# Patient Record
Sex: Male | Born: 1990 | Race: White | Hispanic: No | Marital: Married | Smoking: Never smoker
Health system: Southern US, Community
[De-identification: ages and names within clinical notes are randomized; demographics above are authoritative.]

---

## 2012-05-28 ENCOUNTER — Encounter (HOSPITAL_COMMUNITY): Payer: Self-pay | Admitting: *Deleted

## 2012-05-28 ENCOUNTER — Emergency Department (HOSPITAL_COMMUNITY): Payer: 59

## 2012-05-28 ENCOUNTER — Emergency Department (HOSPITAL_COMMUNITY)
Admission: EM | Admit: 2012-05-28 | Discharge: 2012-05-29 | Disposition: A | Discharge status: Home / Self Care | Payer: 59 | Attending: Emergency Medicine | Admitting: Emergency Medicine

## 2012-05-28 DIAGNOSIS — S8263XA Displaced fracture of lateral malleolus of unspecified fibula, initial encounter for closed fracture: Secondary | ICD-10-CM | POA: Insufficient documentation

## 2012-05-28 DIAGNOSIS — X58XXXA Exposure to other specified factors, initial encounter: Secondary | ICD-10-CM | POA: Insufficient documentation

## 2012-05-28 MED ORDER — ETOMIDATE 2 MG/ML IV SOLN
10.0000 mg | Freq: Once | INTRAVENOUS | Status: DC
  Filled 2012-05-28: qty 10

## 2012-05-28 MED ORDER — OXYCODONE-ACETAMINOPHEN 5-325 MG PO TABS
1.0000 | ORAL_TABLET | Freq: Once | ORAL | Status: AC
  Administered 2012-05-29: 1 via ORAL
  Filled 2012-05-28: qty 1

## 2012-05-28 MED ORDER — ONDANSETRON HCL 4 MG/2ML IJ SOLN
INTRAMUSCULAR | Status: AC
  Filled 2012-05-28: qty 2

## 2012-05-28 MED ORDER — OXYCODONE-ACETAMINOPHEN 5-325 MG PO TABS: 1.0000 | ORAL_TABLET | Freq: Four times a day (QID) | ORAL | Status: AC | PRN

## 2012-05-28 MED ORDER — ETOMIDATE 2 MG/ML IV SOLN
INTRAVENOUS | Status: DC | PRN
  Administered 2012-05-28: 10 mg via INTRAVENOUS

## 2012-05-28 MED ORDER — HYDROMORPHONE HCL PF 1 MG/ML IJ SOLN
1.0000 mg | Freq: Once | INTRAMUSCULAR | Status: AC
  Administered 2012-05-28: 1 mg via INTRAVENOUS
  Filled 2012-05-28 (×2): qty 1

## 2012-05-28 MED ORDER — HYDROMORPHONE HCL PF 1 MG/ML IJ SOLN
1.0000 mg | Freq: Once | INTRAMUSCULAR | Status: AC
  Administered 2012-05-28: 1 mg via INTRAVENOUS
  Filled 2012-05-28: qty 1

## 2012-05-28 MED ORDER — HYDROMORPHONE HCL PF 1 MG/ML IJ SOLN
INTRAMUSCULAR | Status: DC | PRN
  Administered 2012-05-28: 1 mg

## 2012-05-28 MED ORDER — HYDROMORPHONE HCL PF 1 MG/ML IJ SOLN
1.0000 mg | Freq: Once | INTRAMUSCULAR | Status: DC
  Filled 2012-05-28: qty 1

## 2012-05-28 MED ORDER — ONDANSETRON HCL 4 MG/2ML IJ SOLN
4.0000 mg | Freq: Once | INTRAMUSCULAR | Status: AC
  Administered 2012-05-28: 4 mg via INTRAVENOUS

## 2012-05-28 NOTE — ED Notes (Signed)
Conscious sedation being performed presently

## 2012-05-28 NOTE — ED Notes (Signed)
10/10  O2 sat decreased to 91 %,  Due to medication,  Pt placed on oxygen at [facility]  Increased to 100 %  ,  All vital signs stable presently

## 2012-05-28 NOTE — ED Provider Notes (Addendum)
History     CSN: [id number]  Arrival date & time 05/28/12  1956   First MD Initiated Contact with Patient 05/28/12 2046      Chief Complaint  Patient presents with  . Ankle Injury    (Consider location/radiation/quality/duration/timing/severity/associated sxs/prior treatment) HPI Comments: Playing rugby felt pop in ankle while turning   The history is provided by the patient.    History reviewed. No pertinent past medical history.  History reviewed. No pertinent past surgical history.  History reviewed. No pertinent family history.  History  Substance Use Topics  . Smoking status: Never Smoker   . Smokeless tobacco: Not on file  . Alcohol Use: No      Review of Systems  Constitutional: Negative for fever and chills.  Musculoskeletal: Positive for joint swelling and gait problem.  Skin: Negative for wound.  Neurological: Negative for dizziness, weakness and numbness.    Allergies  Review of patient's allergies indicates no known allergies.  Home Medications   Current Outpatient Rx  Name Route Sig Dispense Refill  . OXYCODONE-ACETAMINOPHEN 5-325 MG PO TABS Oral Take 1-2 tablets by mouth every 6 (six) hours as needed for pain. 20 tablet 0    BP 157/94  Pulse 86  Temp 98.5 F (36.9 C) (Oral)  Resp 21  SpO2 100%  Physical Exam  Constitutional: He appears well-developed.  HENT:  Head: Normocephalic.  Eyes: Pupils are equal, round, and reactive to light.  Neck: Normal range of motion.  Cardiovascular: Normal rate.   Pulmonary/Chest: Effort normal.  Musculoskeletal: Normal range of motion. He exhibits tenderness. He exhibits no edema.  Neurological: He is alert.  Skin: Skin is warm.    ED Course  Procedures (including critical care time)  Labs Reviewed - No data to display Dg Ankle Complete Left  05/28/2012  *RADIOLOGY REPORT*  Clinical Data: Right the injury.  Left ankle pain and swelling.  LEFT ANKLE COMPLETE - 3+ VIEW  Comparison: None.   Findings: Oblique fracture lateral malleolus noted.  Distal tibia is anteriorly dislocated with respect to the talus.  Indistinct calcifications posterior to the distal tibia are from uncertain donor site but possibly the medial malleolus or posterior malleolus.  IMPRESSION:  1.  Fracture dislocation of the ankle, with anterior dislocation of the tibia with respect to the talus, oblique lateral malleolar fracture, and indistinct bony fragments posterior to the distal tibial rim possibly representing avulsion from the medial malleolus.   Original Report Authenticated By: Dellia Cloud, M.D.    Dg Ankle Left Port  05/28/2012  *RADIOLOGY REPORT*  Clinical Data: Status post fracture reduction.  PORTABLE LEFT ANKLE - 2 VIEW  Comparison: Plain films 05/28/2012 2042 hours.  Findings: The patient is in a plaster splint.  The tibiotalar joint has been reduced.  Distal fibular fracture again seen.  No new abnormality.  IMPRESSION: Successful reduction of the tibiotalar joint.  No new abnormality.   Original Report Authenticated By: Bernadene Bell. D'ALESSIO, M.D.      1. Ankle fracture, lateral malleolus, closed       MDM   Extra reviewed.  Patient has a anterior malleolus fracture with dislocation. Dr. Cathren Laine  performed.  Conscious sedation.  Relocating the ankle confirmed by x-ray At time of discharge.  Patient has positive sensation in toes, good color, and temperature decreased pain.  He is alert and oriented, and at his baseline mental status.  Parents are at the bedside.  All understand discharge instructions  Arman Filter, NP 05/28/12 2355  Arman Filter, NP 06/27/12 1103

## 2012-05-28 NOTE — ED Notes (Signed)
Pt returned from xray

## 2012-05-28 NOTE — ED Notes (Signed)
[id number]<AV> Expected date:<BR> Expected time:<BR> Means of arrival:<BR> Comments:<BR> EMS  Ankle deformity

## 2012-05-28 NOTE — ED Notes (Signed)
Pt was playing Rugby and he injured his left ankle "states  Heard multiple pops",  Pt has an 18 gauge IV  Left arm,  He had 150 mcg of Fentanyl enroute,  Pain 5/10  Presently,  Left ankle is swollen ,  Pulse present,  B/p 128/70  hr90  Pt has stabilizing device on left lower extremity

## 2012-05-28 NOTE — ED Notes (Signed)
Pt had 150 mcg Fentanyl enroute via EMS  [employer]

## 2012-05-28 NOTE — ED Notes (Signed)
Xray film at this time to confirm placement

## 2012-05-28 NOTE — ED Notes (Signed)
X-ray at bedside

## 2012-05-29 NOTE — ED Notes (Signed)
Sedation completed by RN Carollee Herter

## 2012-05-30 NOTE — ED Provider Notes (Signed)
Medical screening examination/treatment/procedure(s) were performed by non-physician practitioner and as supervising physician I was immediately available for consultation/collaboration.   India Jolin E Morrison Mcbryar, MD 05/30/12 0721 

## 2012-07-01 NOTE — ED Provider Notes (Signed)
Medical screening examination/treatment/procedure(s) were performed by non-physician practitioner and as supervising physician I was immediately available for consultation/collaboration.   Suzi Roots, MD 07/01/12 [phone]

## 2013-12-11 ENCOUNTER — Ambulatory Visit: Payer: 59

## 2020-12-20 ENCOUNTER — Emergency Department (HOSPITAL_BASED_OUTPATIENT_CLINIC_OR_DEPARTMENT_OTHER)
Admission: EM | Admit: 2020-12-20 | Discharge: 2020-12-21 | Disposition: A | Discharge status: Home / Self Care | Payer: No Typology Code available for payment source | Attending: Emergency Medicine | Admitting: Emergency Medicine

## 2020-12-20 ENCOUNTER — Other Ambulatory Visit: Payer: Self-pay

## 2020-12-20 DIAGNOSIS — Y9331 Activity, mountain climbing, rock climbing and wall climbing: Secondary | ICD-10-CM | POA: Diagnosis not present

## 2020-12-20 DIAGNOSIS — R070 Pain in throat: Secondary | ICD-10-CM | POA: Insufficient documentation

## 2020-12-20 DIAGNOSIS — W228XXA Striking against or struck by other objects, initial encounter: Secondary | ICD-10-CM | POA: Insufficient documentation

## 2020-12-20 DIAGNOSIS — S1980XA Other specified injuries of unspecified part of neck, initial encounter: Secondary | ICD-10-CM

## 2020-12-20 DIAGNOSIS — S199XXA Unspecified injury of neck, initial encounter: Secondary | ICD-10-CM | POA: Insufficient documentation

## 2020-12-20 MED ORDER — SODIUM CHLORIDE 0.9 % IV BOLUS
1000.0000 mL | Freq: Once | INTRAVENOUS | Status: AC
  Administered 2020-12-21: 1000 mL via INTRAVENOUS

## 2020-12-20 NOTE — ED Provider Notes (Signed)
[facility] EMERGENCY DEPT Provider Note   CSN: [id number] Arrival date & time: 12/20/20  2335     History Chief Complaint  Patient presents with  . Throat Pain    Maurice Wyatt is a 30 y.o. male.  Patient is a 30 year old male with no significant past medical history.  He presents today with complaints of neck/throat pain.  Patient was climbing on a climbing wall 2 days ago when another individual inadvertently kicked him in the neck.  He was struck just to the left of the larynx and has had pain in this area since.  There is a swollen area when he presses.  He is concerned about the possibility of arterial injury.  He denies any headache, weakness, or visual disturbances.  He denies any difficulty swallowing.  Pain is worse with palpation.  The history is provided by the patient.       No past medical history on file.  There are no problems to display for this patient.   No past surgical history on file.     No family history on file.  Social History   Tobacco Use  . Smoking status: Never Smoker  Substance Use Topics  . Alcohol use: No  . Drug use: No    Home Medications Prior to Admission medications   Medication Sig Start Date End Date Taking? Authorizing Provider  oxyCODONE-acetaminophen (PERCOCET/ROXICET) 5-325 MG per tablet Take 1-2 tablets by mouth every 6 (six) hours as needed for pain. 05/28/12   Earley Favor, NP    Allergies    Patient has no known allergies.  Review of Systems   Review of Systems  All other systems reviewed and are negative.   Physical Exam Updated Vital Signs BP (!) 151/101 (BP Location: Right Arm)   Pulse 65   Temp 97.7 F (36.5 C) (Oral)   Resp 18   Ht 5\' 8"  (1.727 m)   Wt 79.4 kg   SpO2 100%   BMI 26.61 kg/m   Physical Exam Vitals and nursing note reviewed.  Constitutional:      General: He is not in acute distress.    Appearance: He is well-developed. He is not diaphoretic.  HENT:     Head:  Normocephalic and atraumatic.  Neck:     Vascular: No carotid bruit.     Comments: There is some fullness noted in the soft tissues of the anterior neck, just left of the larynx.  There are no audible bruits or palpable thrills. Cardiovascular:     Rate and Rhythm: Normal rate and regular rhythm.     Heart sounds: No murmur heard. No friction rub.  Pulmonary:     Effort: Pulmonary effort is normal. No respiratory distress.     Breath sounds: Normal breath sounds. No wheezing or rales.  Musculoskeletal:        General: Normal range of motion.     Cervical back: Normal range of motion and neck supple. Tenderness present.  Lymphadenopathy:     Cervical: No cervical adenopathy.  Skin:    General: Skin is warm and dry.  Neurological:     Mental Status: He is alert and oriented to person, place, and time.     Coordination: Coordination normal.     ED Results / Procedures / Treatments   Labs (all labs ordered are listed, but only abnormal results are displayed) Labs Reviewed  CBC WITH DIFFERENTIAL/PLATELET  BASIC METABOLIC PANEL    EKG None  Radiology No results found.  Procedures Procedures   Medications Ordered in ED Medications  sodium chloride 0.9 % bolus 1,000 mL (has no administration in time range)    ED Course  I have reviewed the triage vital signs and the nursing notes.  Pertinent labs & imaging results that were available during my care of the patient were reviewed by me and considered in my medical decision making (see chart for details).    MDM Rules/Calculators/A&P  Patient presenting with a 2-day history of pain in his neck/throat after being kicked in the neck while rockclimbing.  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  A CTA of the neck was obtained here showing no acute abnormality.  Patient seems appropriate for discharge with ibuprofen and as needed return.  Final Clinical  Impression(s) / ED Diagnoses Final diagnoses:  None    Rx / DC Orders ED Discharge Orders    None       Geoffery Lyons, MD 12/21/20 [phone]

## 2020-12-20 NOTE — ED Triage Notes (Signed)
C/o L sd throat pain radiating up to L ear from being kicked while rock climbing on Sat. Airway patent; no difficulty swallowing.

## 2020-12-21 ENCOUNTER — Encounter (HOSPITAL_BASED_OUTPATIENT_CLINIC_OR_DEPARTMENT_OTHER): Payer: Self-pay | Admitting: Radiology

## 2020-12-21 ENCOUNTER — Emergency Department (HOSPITAL_BASED_OUTPATIENT_CLINIC_OR_DEPARTMENT_OTHER): Payer: No Typology Code available for payment source

## 2020-12-21 LAB — CBC WITH DIFFERENTIAL/PLATELET
Abs Immature Granulocytes: 0.01 10*3/uL (ref 0.00–0.07)
Basophils Absolute: 0 10*3/uL (ref 0.0–0.1)
Basophils Relative: 0 %
Eosinophils Absolute: 0.2 10*3/uL (ref 0.0–0.5)
Eosinophils Relative: 3 %
HCT: 39 % (ref 39.0–52.0)
Hemoglobin: 13.4 g/dL (ref 13.0–17.0)
Immature Granulocytes: 0 %
Lymphocytes Relative: 52 %
Lymphs Abs: 3.6 10*3/uL (ref 0.7–4.0)
MCH: 30.6 pg (ref 26.0–34.0)
MCHC: 34.4 g/dL (ref 30.0–36.0)
MCV: 89 fL (ref 80.0–100.0)
Monocytes Absolute: 0.7 10*3/uL (ref 0.1–1.0)
Monocytes Relative: 10 %
Neutro Abs: 2.4 10*3/uL (ref 1.7–7.7)
Neutrophils Relative %: 35 %
Platelets: 203 10*3/uL (ref 150–400)
RBC: 4.38 MIL/uL (ref 4.22–5.81)
RDW: 12.2 % (ref 11.5–15.5)
WBC: 6.9 10*3/uL (ref 4.0–10.5)
nRBC: 0 % (ref 0.0–0.2)

## 2020-12-21 LAB — BASIC METABOLIC PANEL
Anion gap: 10 (ref 5–15)
BUN: 15 mg/dL (ref 6–20)
CO2: 26 mmol/L (ref 22–32)
Calcium: 9.7 mg/dL (ref 8.9–10.3)
Chloride: 100 mmol/L (ref 98–111)
Creatinine, Ser: 1.02 mg/dL (ref 0.61–1.24)
GFR, Estimated: >60 mL/min (ref >60)
Glucose, Bld: 94 mg/dL (ref 70–99)
Potassium: 3.3 mmol/L — ABNORMAL LOW (ref 3.5–5.1)
Sodium: 136 mmol/L (ref 135–145)

## 2020-12-21 MED ORDER — IOHEXOL 350 MG/ML SOLN
100.0000 mL | Freq: Once | INTRAVENOUS | Status: AC | PRN
  Administered 2020-12-21: 75 mL via INTRAVENOUS

## 2020-12-21 NOTE — Discharge Instructions (Addendum)
Take ibuprofen 600 mg every 6 hours as needed for pain.  Rest.  Follow-up with primary doctor if symptoms are not improving in the next week.

## 2020-12-21 NOTE — ED Notes (Signed)
Patient transported to CT at this time. 

## 2021-04-14 ENCOUNTER — Emergency Department (HOSPITAL_BASED_OUTPATIENT_CLINIC_OR_DEPARTMENT_OTHER)
Admission: EM | Admit: 2021-04-14 | Discharge: 2021-04-14 | Disposition: A | Discharge status: Home / Self Care | Payer: No Typology Code available for payment source | Attending: Emergency Medicine | Admitting: Emergency Medicine

## 2021-04-14 ENCOUNTER — Emergency Department (HOSPITAL_BASED_OUTPATIENT_CLINIC_OR_DEPARTMENT_OTHER): Payer: No Typology Code available for payment source | Admitting: Radiology

## 2021-04-14 ENCOUNTER — Encounter (HOSPITAL_BASED_OUTPATIENT_CLINIC_OR_DEPARTMENT_OTHER): Payer: Self-pay | Admitting: Emergency Medicine

## 2021-04-14 ENCOUNTER — Other Ambulatory Visit: Payer: Self-pay

## 2021-04-14 DIAGNOSIS — R079 Chest pain, unspecified: Secondary | ICD-10-CM | POA: Insufficient documentation

## 2021-04-14 DIAGNOSIS — R0602 Shortness of breath: Secondary | ICD-10-CM | POA: Diagnosis not present

## 2021-04-14 LAB — BASIC METABOLIC PANEL
Anion gap: 11 (ref 5–15)
BUN: 14 mg/dL (ref 6–20)
CO2: 25 mmol/L (ref 22–32)
Calcium: 9.8 mg/dL (ref 8.9–10.3)
Chloride: 98 mmol/L (ref 98–111)
Creatinine, Ser: 0.91 mg/dL (ref 0.61–1.24)
GFR, Estimated: >60 mL/min (ref >60)
Glucose, Bld: 90 mg/dL (ref 70–99)
Potassium: 4 mmol/L (ref 3.5–5.1)
Sodium: 134 mmol/L — ABNORMAL LOW (ref 135–145)

## 2021-04-14 LAB — CBC WITH DIFFERENTIAL/PLATELET
Abs Immature Granulocytes: 0.03 10*3/uL (ref 0.00–0.07)
Basophils Absolute: 0 10*3/uL (ref 0.0–0.1)
Basophils Relative: 1 %
Eosinophils Absolute: 0 10*3/uL (ref 0.0–0.5)
Eosinophils Relative: 1 %
HCT: 38.5 % — ABNORMAL LOW (ref 39.0–52.0)
Hemoglobin: 13.4 g/dL (ref 13.0–17.0)
Immature Granulocytes: 1 %
Lymphocytes Relative: 29 %
Lymphs Abs: 1.9 10*3/uL (ref 0.7–4.0)
MCH: 30.9 pg (ref 26.0–34.0)
MCHC: 34.8 g/dL (ref 30.0–36.0)
MCV: 88.9 fL (ref 80.0–100.0)
Monocytes Absolute: 0.6 10*3/uL (ref 0.1–1.0)
Monocytes Relative: 9 %
Neutro Abs: 3.9 10*3/uL (ref 1.7–7.7)
Neutrophils Relative %: 59 %
Platelets: 213 10*3/uL (ref 150–400)
RBC: 4.33 MIL/uL (ref 4.22–5.81)
RDW: 12.4 % (ref 11.5–15.5)
WBC: 6.4 10*3/uL (ref 4.0–10.5)
nRBC: 0 % (ref 0.0–0.2)

## 2021-04-14 LAB — TROPONIN I (HIGH SENSITIVITY)
Troponin I (High Sensitivity): 2 ng/L (ref <18)
Troponin I (High Sensitivity): 3 ng/L (ref <18)

## 2021-04-14 NOTE — ED Provider Notes (Signed)
[facility] EMERGENCY DEPT Provider Note   CSN: [id number] Arrival date & time: 04/14/21  1119     History Chief Complaint  Patient presents with   Chest Pain    Maurice Wyatt is a 30 y.o. male.  HPI 30 year old male presents with chest pain.  3 days ago when he was working out for the first time in a couple weeks he placed himself very hard, at times getting his heart rate up to 200.  After the workout he felt brief chest pain that was sharp/pressure in the middle of his chest.  Lasted about a minute or less.  Since then he is worked out but not as hard and had no issues.  Today he worked out again and again noticed this discomfort at the end of his workout.  He was short of breath as he typically is from riding the bike but not worse than that.  Chest pain again lasted less than a minute.  Right now he is feeling fine.  No leg swelling. No radiation of the pain.  History reviewed. No pertinent past medical history.  There are no problems to display for this patient.   History reviewed. No pertinent surgical history.     History reviewed. No pertinent family history.  Social History   Tobacco Use   Smoking status: Never  Substance Use Topics   Alcohol use: No   Drug use: No    Home Medications Prior to Admission medications   Medication Sig Start Date End Date Taking? Authorizing Provider  oxyCODONE-acetaminophen (PERCOCET/ROXICET) 5-325 MG per tablet Take 1-2 tablets by mouth every 6 (six) hours as needed for pain. 05/28/12   Earley Favor, NP    Allergies    Patient has no known allergies.  Review of Systems   Review of Systems  Respiratory:  Negative for cough and shortness of breath.   Cardiovascular:  Positive for chest pain. Negative for leg swelling.  Gastrointestinal:  Negative for abdominal pain.  All other systems reviewed and are negative.  Physical Exam Updated Vital Signs BP (!) 143/87 (BP Location: Right Arm)   Pulse 91   Temp  98.6 F (37 C) (Oral)   Resp 13   Ht 5\' 8"  (1.727 m)   Wt 77.1 kg   SpO2 100%   BMI 25.85 kg/m   Physical Exam Vitals and nursing note reviewed.  Constitutional:      General: He is not in acute distress.    Appearance: He is well-developed. He is not ill-appearing or diaphoretic.  HENT:     Head: Normocephalic and atraumatic.     Right Ear: External ear normal.     Left Ear: External ear normal.     Nose: Nose normal.  Eyes:     General:        Right eye: No discharge.        Left eye: No discharge.  Cardiovascular:     Rate and Rhythm: Normal rate and regular rhythm.     Heart sounds: Normal heart sounds.  Pulmonary:     Effort: Pulmonary effort is normal.     Breath sounds: Normal breath sounds.  Chest:     Chest wall: No tenderness.  Abdominal:     Palpations: Abdomen is soft.     Tenderness: There is no abdominal tenderness.  Skin:    General: Skin is warm and dry.  Neurological:     Mental Status: He is alert.  Psychiatric:  Mood and Affect: Mood is not anxious.    ED Results / Procedures / Treatments   Labs (all labs ordered are listed, but only abnormal results are displayed) Labs Reviewed  BASIC METABOLIC PANEL - Abnormal; Notable for the following components:      Result Value   Sodium 134 (*)    All other components within normal limits  CBC WITH DIFFERENTIAL/PLATELET - Abnormal; Notable for the following components:   HCT 38.5 (*)    All other components within normal limits  TROPONIN I (HIGH SENSITIVITY)  TROPONIN I (HIGH SENSITIVITY)    EKG EKG Interpretation  Date/Time:  Thursday April 14 2021 11:34:07 EDT Ventricular Rate:  90 PR Interval:  147 QRS Duration: 101 QT Interval:  341 QTC Calculation: 418 R Axis:   90 Text Interpretation: Sinus rhythm Borderline right axis deviation no acute ST/T changes No old tracing to compare Confirmed by Pricilla Loveless [phone]) on 04/14/2021 11:40:07 AM  Radiology DG Chest 2 View  Result  Date: 04/14/2021 CLINICAL DATA:  Transient chest pain. Additional provided: Patient reports centralized chest pain which began on Monday 8/1, substernal and described as tightness, chest pain returned today. EXAM: CHEST - 2 VIEW COMPARISON:  CT angiogram neck 12/21/2020. FINDINGS: Heart size within normal limits. No appreciable airspace consolidation. No evidence of pleural effusion or pneumothorax. No acute bony abnormality identified. IMPRESSION: No evidence of active cardiopulmonary disease. Electronically Signed   By: Jackey Loge DO   On: 04/14/2021 12:25    Procedures Procedures   Medications Ordered in ED Medications - No data to display  ED Course  I have reviewed the triage vital signs and the nursing notes.  Pertinent labs & imaging results that were available during my care of the patient were reviewed by me and considered in my medical decision making (see chart for details).    MDM Rules/Calculators/A&P                           Patient presents with brief, atypical chest pain.  It does start after he is done with a workout but also lasts less than a minute.  ECG without acute ischemia.  Given his relative health, low suspicion that this is a cardiac cause.  However he would like to proceed with labs, chest x-ray.  Initial troponin is negative.  Other labs and chest x-ray are unremarkable.  He would like to draw the second troponin and be discharged and follow-up the results in MyChart.  I do not think this is unreasonable given low suspicion for cardiac cause.  I also have very low suspicion for PE, dissection, pericardial effusion, esophageal perforation, pneumothorax, etc.  Given return precautions. Final Clinical Impression(s) / ED Diagnoses Final diagnoses:  Nonspecific chest pain    Rx / DC Orders ED Discharge Orders     None        Pricilla Loveless, MD 04/14/21 1333

## 2021-04-14 NOTE — ED Notes (Signed)
Patient verbalizes understanding of discharge instructions. Opportunity for questioning and answers were provided. Patient discharged from ED.  °

## 2021-04-14 NOTE — ED Triage Notes (Signed)
Pt arrives to ED with c/o of centralized chest pain that started on Monday 8/1. Pt reports the pain was substernal and described as tightness. The CP returned today. The episodes last less than a minute.

## 2021-04-14 NOTE — Discharge Instructions (Addendum)
If you develop recurrent, continued, or worsening chest pain, shortness of breath, fever, vomiting, abdominal or back pain, or any other new/concerning symptoms then return to the ER for evaluation.  

## 2022-03-18 IMAGING — CT CT ANGIO NECK
2 of 4 series · 8 of 14 positions shown · IV contrast (APPLIED)
Comparison: None.

CLINICAL DATA: Initial evaluation for recent neck trauma,
left-sided neck pain.

EXAM:
CT ANGIOGRAPHY NECK
TECHNIQUE: Multidetector CT imaging of the neck was performed using the
standard protocol during bolus administration of intravenous
contrast. Multiplanar CT image reconstructions and MIPs were
obtained to evaluate the vascular anatomy. Carotid stenosis
measurements (when applicable) are obtained utilizing NASCET
criteria, using the distal internal carotid diameter as the
denominator.
CONTRAST:  75mL OMNIPAQUE IOHEXOL 350 MG/ML SOLN

[Series 5: cta neck · axial · 0.45mm/px · z∈[-269,-74]mm · 6 of 589 slices shown]
[im 85/589  soft-tissue]
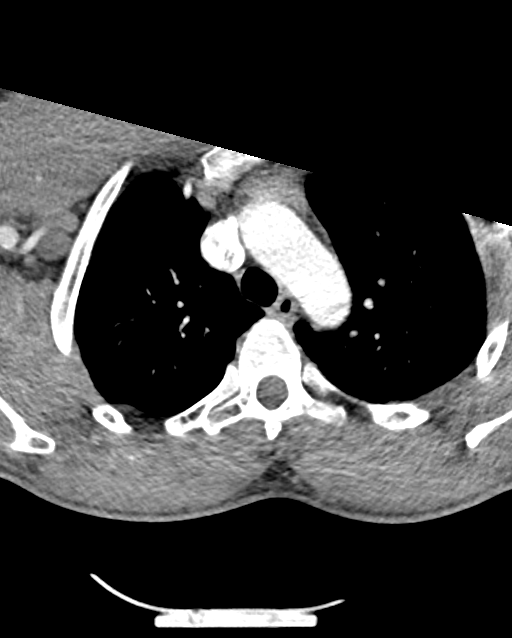
[im 169/589  bone]
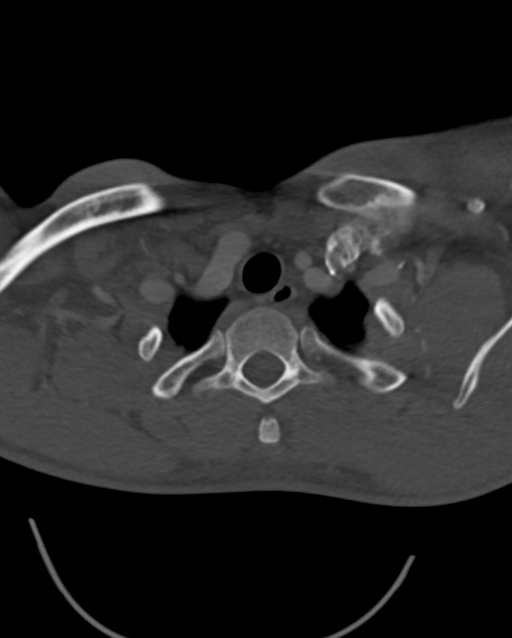
[im 253/589  soft-tissue]
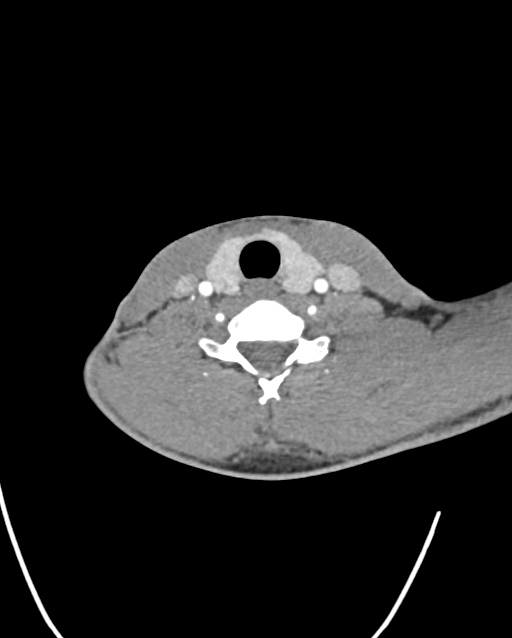
[im 337/589  bone]
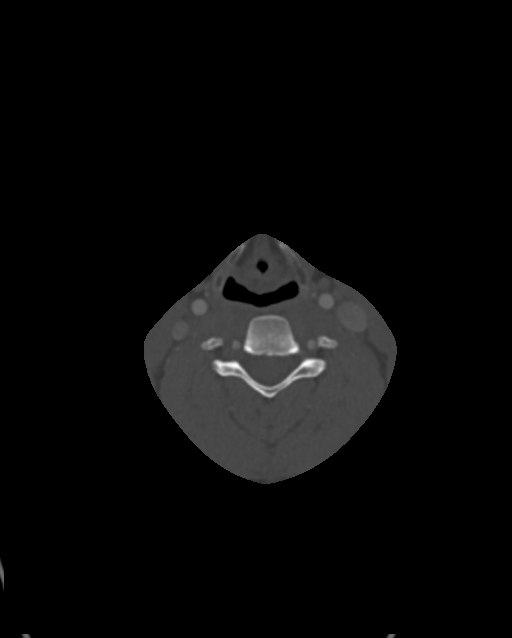
[im 421/589  soft-tissue]
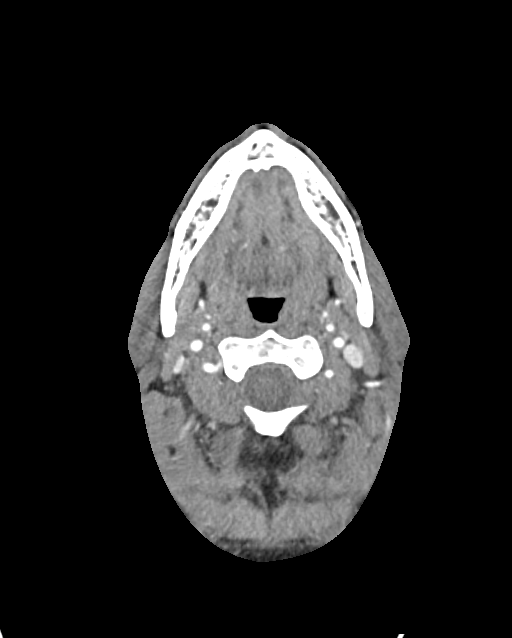
[im 505/589  bone]
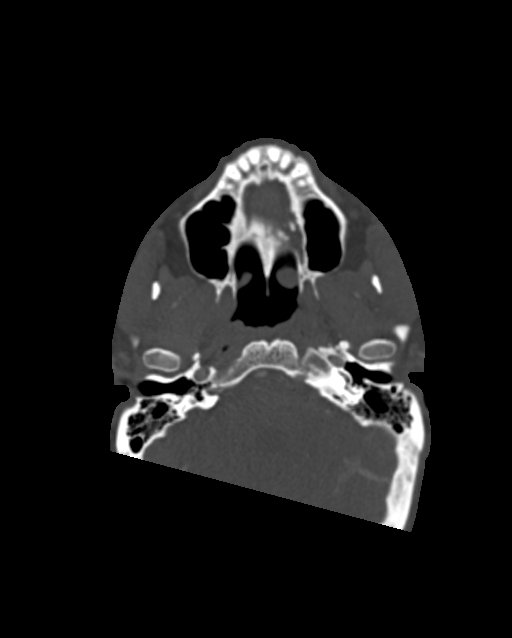

[Series 6: ax thin · axial · 0.45mm/px · z∈[-217,-126]mm · 2 of 295 slices shown]
[im 99/295  soft-tissue]
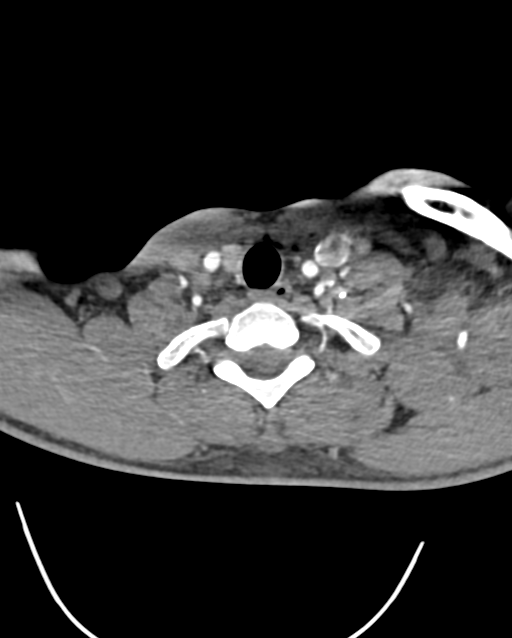
[im 197/295  soft-tissue]
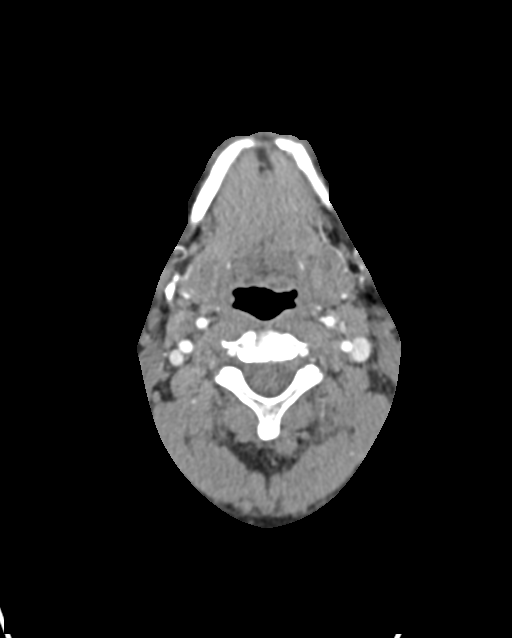

[8 of 14 positions shown; findings below may reference images not displayed]

FINDINGS: Aortic arch: Visualized aortic arch normal caliber with normal
branch pattern. No stenosis or other abnormality about the origin of
the great vessels. Visualized subclavian arteries widely patent.

Right carotid system: Right common and internal carotid arteries
widely patent without stenosis, dissection or occlusion.

Left carotid system: Left common and internal carotid arteries
widely patent without stenosis, dissection or occlusion.

Vertebral arteries: Both vertebral arteries arise from the
subclavian arteries. No proximal subclavian artery stenosis. Both
vertebral arteries widely patent without stenosis, dissection or
occlusion.

Skeleton: No visible acute osseous abnormality. No discrete or
worrisome osseous lesions.

Other neck: No other acute soft tissue abnormality within the neck.
No mass or adenopathy.

Upper chest: Visualized upper chest demonstrates no acute finding.
IMPRESSION: Normal CTA of the neck. No evidence for acute traumatic vascular
injury to the major arterial vasculature of the neck.

## 2022-07-10 IMAGING — DX DG CHEST 2V
2 series · 2 of 2 positions shown · non-contrast
Comparison: CT angiogram neck 12/21/2020.

CLINICAL DATA: Transient chest pain. Additional provided: Patient
reports centralized chest pain which began on [REDACTED] [DATE], substernal
and described as tightness, chest pain returned today.

EXAM:
CHEST - 2 VIEW

[chest pa]
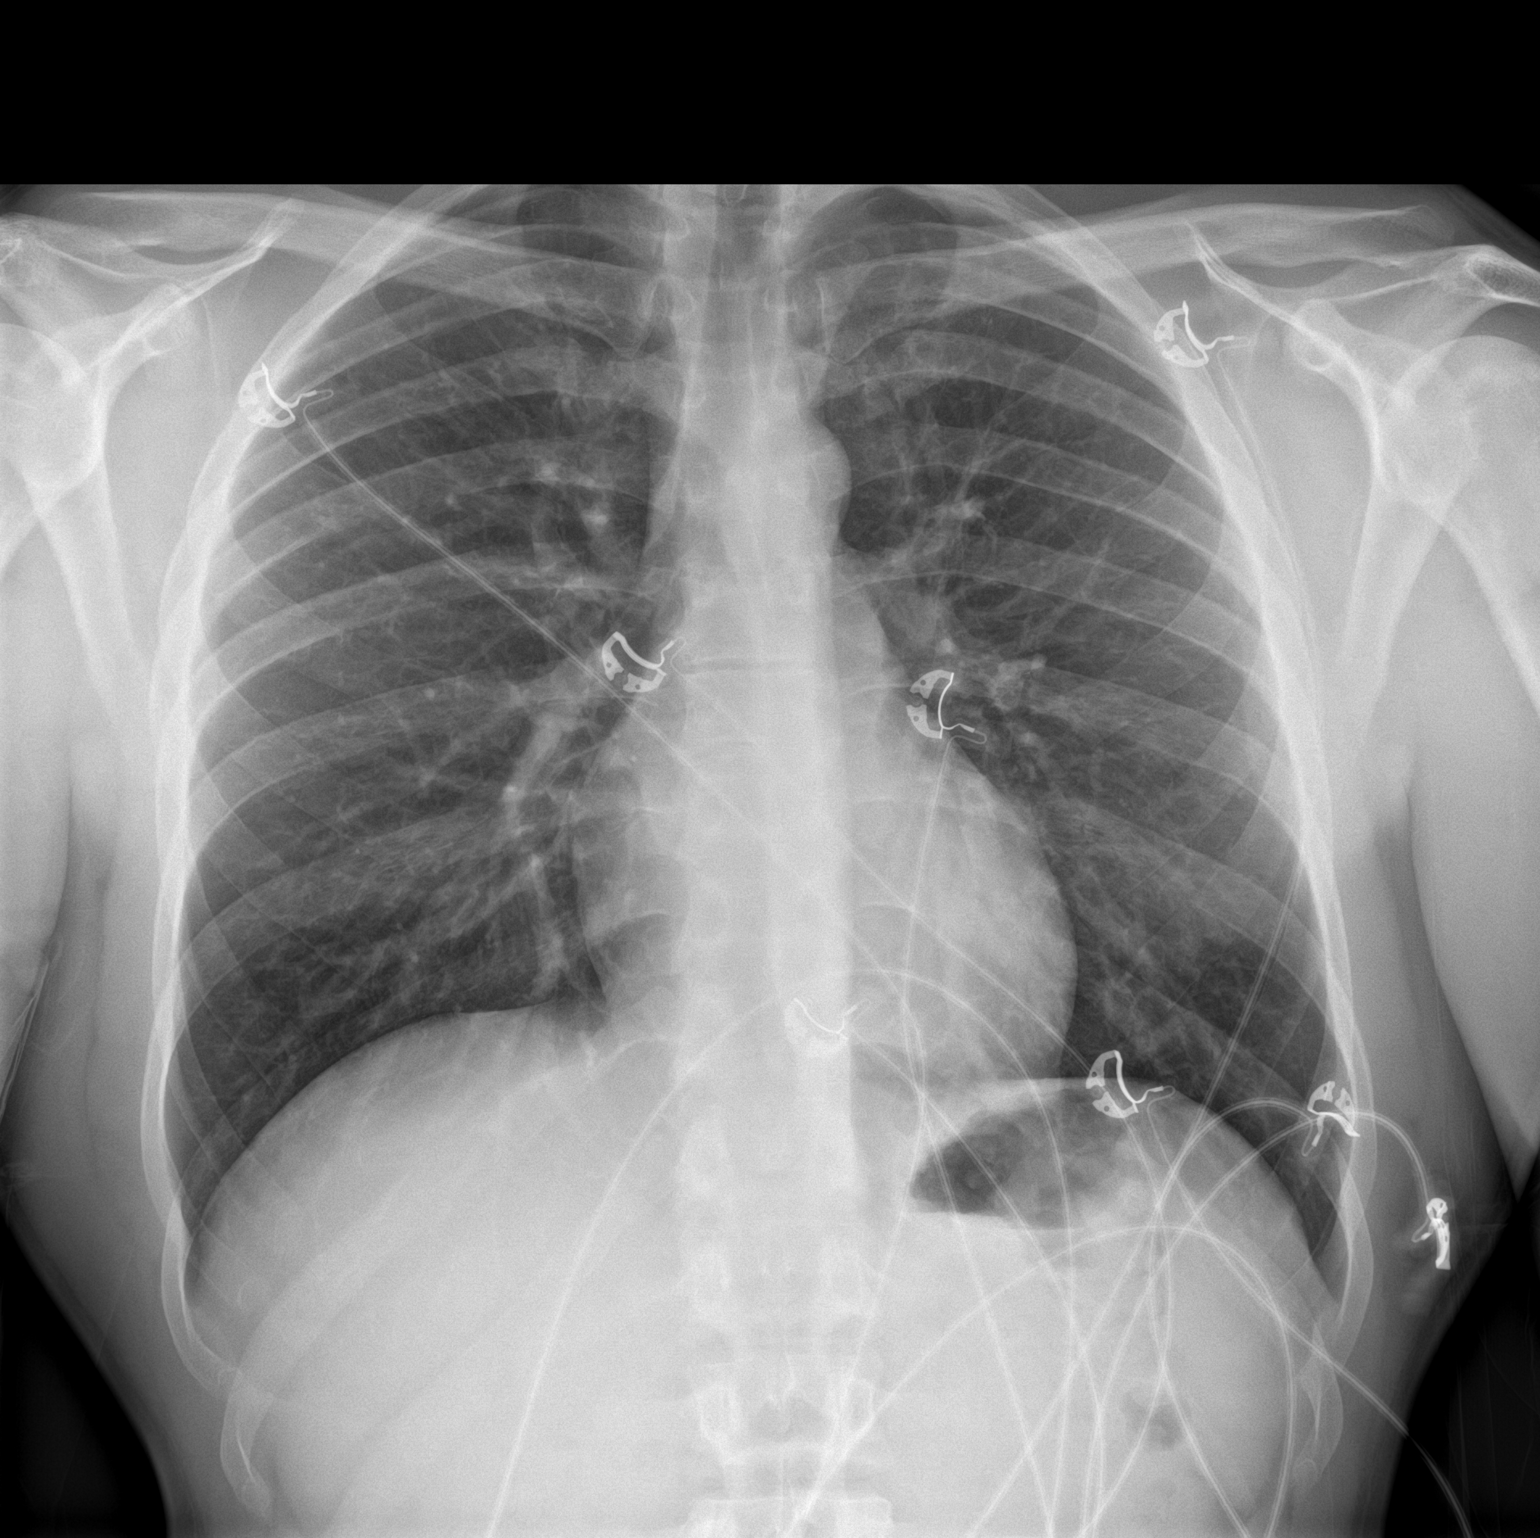

[chest lat]
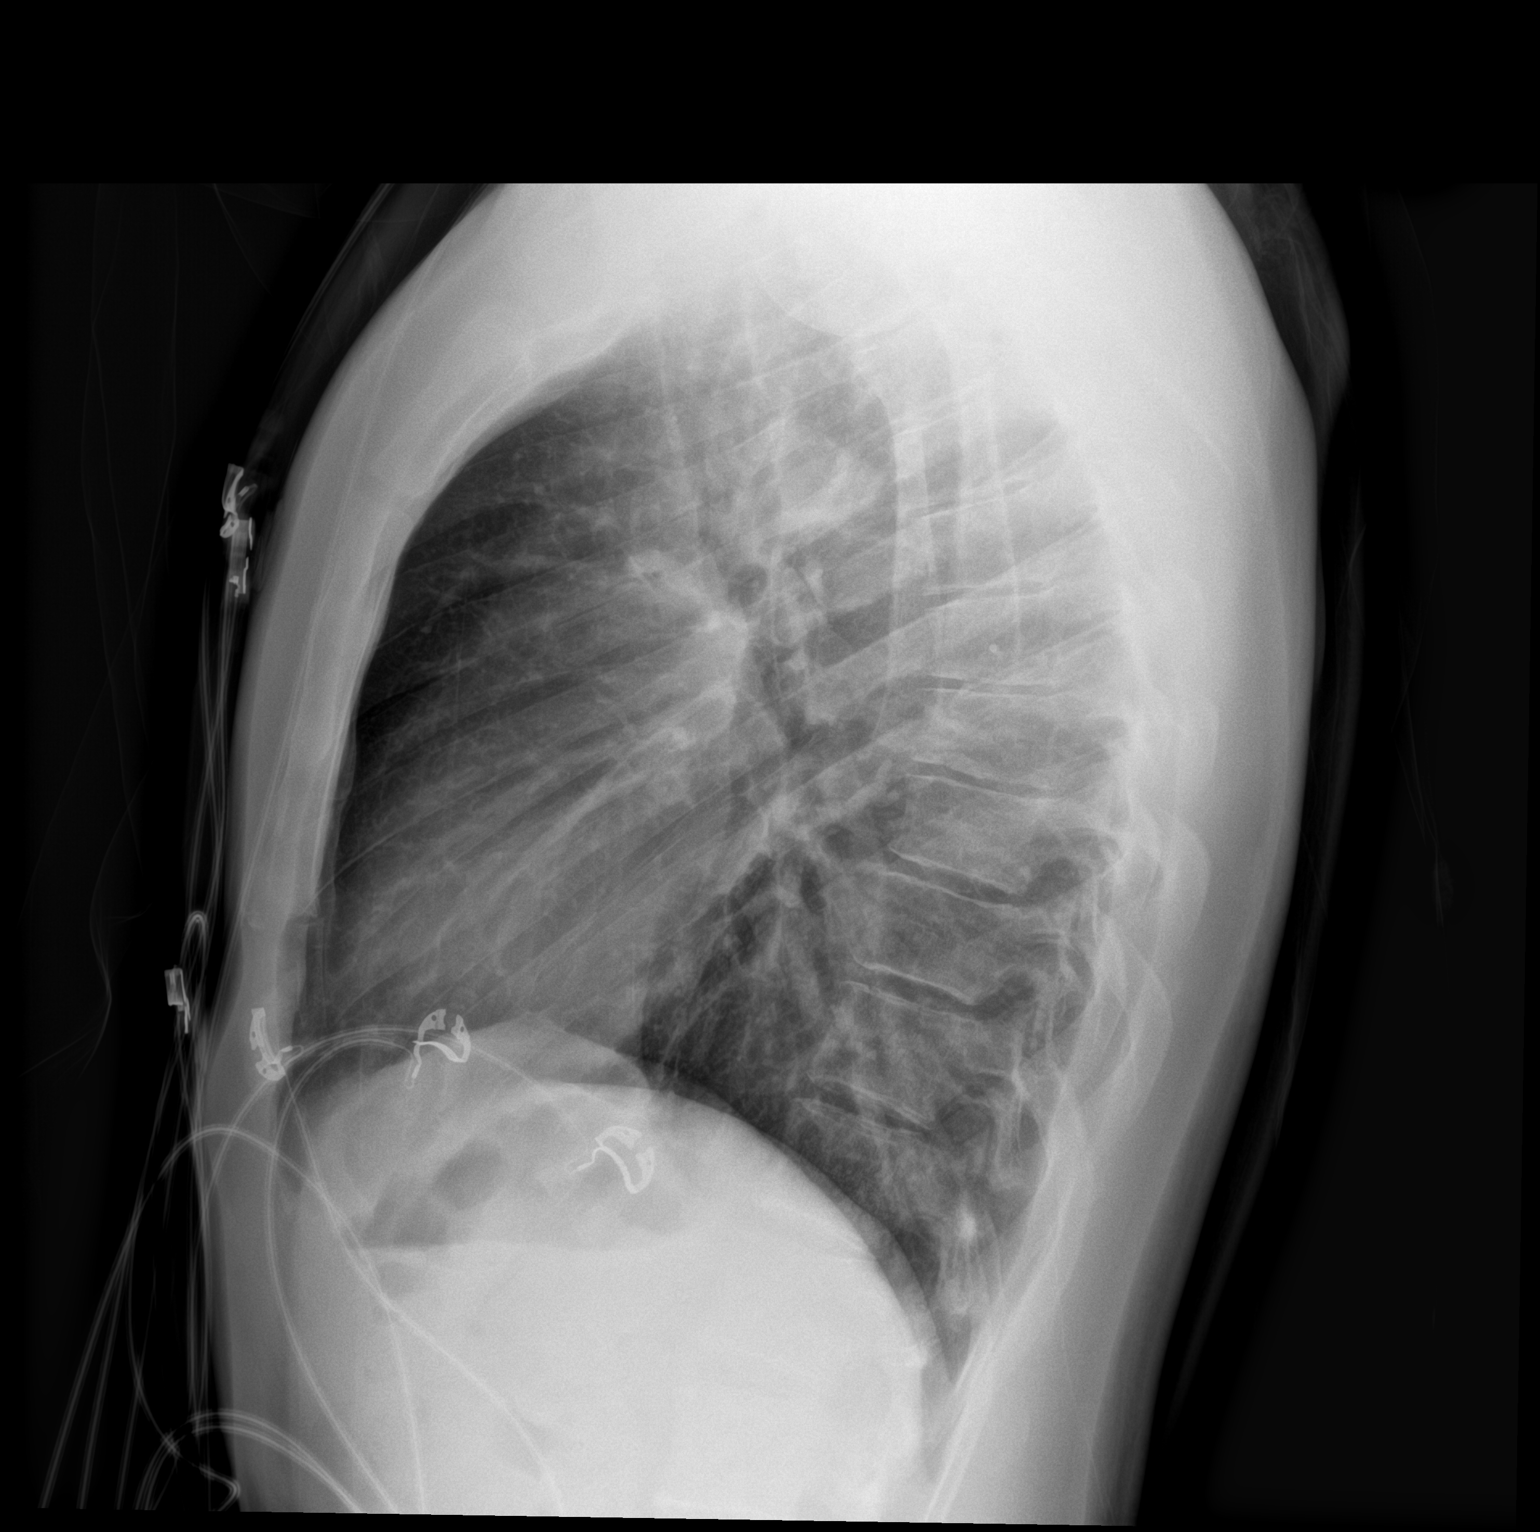

[2 of 2 positions shown; findings below may reference images not displayed]

FINDINGS: Heart size within normal limits. No appreciable airspace
consolidation. No evidence of pleural effusion or pneumothorax. No
acute bony abnormality identified.
IMPRESSION: No evidence of active cardiopulmonary disease.
